# Patient Record
Sex: Female | Born: 2000 | Race: White | Hispanic: No | Marital: Single | Smoking: Never smoker
Health system: Southern US, Community
[De-identification: ages and names within clinical notes are randomized; demographics above are authoritative.]

## PROBLEM LIST (undated history)

## (undated) HISTORY — PX: WISDOM TOOTH EXTRACTION: SHX21

## (undated) HISTORY — PX: DENTAL SURGERY: SHX609

---
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
## 2022-07-02 DIAGNOSIS — Z114 Encounter for screening for human immunodeficiency virus [HIV]: Secondary | ICD-10-CM | POA: Diagnosis not present

## 2022-07-02 DIAGNOSIS — Z113 Encounter for screening for infections with a predominantly sexual mode of transmission: Secondary | ICD-10-CM | POA: Diagnosis not present

## 2022-07-04 DIAGNOSIS — H65191 Other acute nonsuppurative otitis media, right ear: Secondary | ICD-10-CM | POA: Diagnosis not present

## 2022-07-04 DIAGNOSIS — J019 Acute sinusitis, unspecified: Secondary | ICD-10-CM | POA: Diagnosis not present

## 2022-08-06 DIAGNOSIS — Z113 Encounter for screening for infections with a predominantly sexual mode of transmission: Secondary | ICD-10-CM | POA: Diagnosis not present

## 2022-08-06 DIAGNOSIS — Z130 Encounter for screening for diseases of the blood and blood-forming organs and certain disorders involving the immune mechanism: Secondary | ICD-10-CM | POA: Diagnosis not present

## 2022-08-06 DIAGNOSIS — Z0000 Encounter for general adult medical examination without abnormal findings: Secondary | ICD-10-CM | POA: Diagnosis not present

## 2022-08-06 DIAGNOSIS — Z1329 Encounter for screening for other suspected endocrine disorder: Secondary | ICD-10-CM | POA: Diagnosis not present

## 2022-08-06 DIAGNOSIS — Z713 Dietary counseling and surveillance: Secondary | ICD-10-CM | POA: Diagnosis not present

## 2022-08-06 DIAGNOSIS — Z131 Encounter for screening for diabetes mellitus: Secondary | ICD-10-CM | POA: Diagnosis not present

## 2022-08-06 DIAGNOSIS — Z13220 Encounter for screening for lipoid disorders: Secondary | ICD-10-CM | POA: Diagnosis not present

## 2022-10-01 DIAGNOSIS — N760 Acute vaginitis: Secondary | ICD-10-CM | POA: Diagnosis not present

## 2022-10-01 DIAGNOSIS — Z113 Encounter for screening for infections with a predominantly sexual mode of transmission: Secondary | ICD-10-CM | POA: Diagnosis not present

## 2022-10-29 DIAGNOSIS — H6123 Impacted cerumen, bilateral: Secondary | ICD-10-CM | POA: Diagnosis not present

## 2022-10-29 DIAGNOSIS — H9191 Unspecified hearing loss, right ear: Secondary | ICD-10-CM | POA: Diagnosis not present

## 2022-10-29 DIAGNOSIS — H66001 Acute suppurative otitis media without spontaneous rupture of ear drum, right ear: Secondary | ICD-10-CM | POA: Diagnosis not present

## 2023-03-10 DIAGNOSIS — Z113 Encounter for screening for infections with a predominantly sexual mode of transmission: Secondary | ICD-10-CM | POA: Diagnosis not present

## 2023-04-30 DIAGNOSIS — R110 Nausea: Secondary | ICD-10-CM | POA: Diagnosis not present

## 2023-04-30 DIAGNOSIS — Z3202 Encounter for pregnancy test, result negative: Secondary | ICD-10-CM | POA: Diagnosis not present

## 2023-04-30 DIAGNOSIS — N643 Galactorrhea not associated with childbirth: Secondary | ICD-10-CM | POA: Diagnosis not present

## 2023-04-30 DIAGNOSIS — N644 Mastodynia: Secondary | ICD-10-CM | POA: Diagnosis not present

## 2023-05-15 ENCOUNTER — Other Ambulatory Visit: Payer: Self-pay

## 2023-05-15 ENCOUNTER — Emergency Department
Admission: EM | Admit: 2023-05-15 | Discharge: 2023-05-15 | Disposition: A | Discharge status: Home / Self Care | Payer: Medicaid Other | Attending: Emergency Medicine | Admitting: Emergency Medicine

## 2023-05-15 DIAGNOSIS — J029 Acute pharyngitis, unspecified: Secondary | ICD-10-CM | POA: Diagnosis not present

## 2023-05-15 LAB — CBC WITH DIFFERENTIAL/PLATELET
Abs Immature Granulocytes: 0.05 10*3/uL (ref 0.00–0.07)
Basophils Absolute: 0 10*3/uL (ref 0.0–0.1)
Basophils Relative: 0 %
Eosinophils Absolute: 0 10*3/uL (ref 0.0–0.5)
Eosinophils Relative: 0 %
HCT: 39.4 % (ref 36.0–46.0)
Hemoglobin: 13.2 g/dL (ref 12.0–15.0)
Immature Granulocytes: 0 %
Lymphocytes Relative: 8 %
Lymphs Abs: 1.1 10*3/uL (ref 0.7–4.0)
MCH: 31 pg (ref 26.0–34.0)
MCHC: 33.5 g/dL (ref 30.0–36.0)
MCV: 92.5 fL (ref 80.0–100.0)
Monocytes Absolute: 1.4 10*3/uL — ABNORMAL HIGH (ref 0.1–1.0)
Monocytes Relative: 9 %
Neutro Abs: 12.3 10*3/uL — ABNORMAL HIGH (ref 1.7–7.7)
Neutrophils Relative %: 83 %
Platelets: 253 10*3/uL (ref 150–400)
RBC: 4.26 MIL/uL (ref 3.87–5.11)
RDW: 12.5 % (ref 11.5–15.5)
WBC: 14.9 10*3/uL — ABNORMAL HIGH (ref 4.0–10.5)
nRBC: 0 % (ref 0.0–0.2)

## 2023-05-15 LAB — BASIC METABOLIC PANEL
Anion gap: 10 (ref 5–15)
BUN: 8 mg/dL (ref 6–20)
CO2: 22 mmol/L (ref 22–32)
Calcium: 9.2 mg/dL (ref 8.9–10.3)
Chloride: 103 mmol/L (ref 98–111)
Creatinine, Ser: 0.83 mg/dL (ref 0.44–1.00)
GFR, Estimated: >60 mL/min (ref >60)
Glucose, Bld: 99 mg/dL (ref 70–99)
Potassium: 4.3 mmol/L (ref 3.5–5.1)
Sodium: 135 mmol/L (ref 135–145)

## 2023-05-15 LAB — GROUP A STREP BY PCR: Group A Strep by PCR: NOT DETECTED

## 2023-05-15 MED ORDER — AMOXICILLIN 500 MG PO TABS: 500.0000 mg | ORAL_TABLET | Freq: Three times a day (TID) | ORAL | 0 refills | Status: DC

## 2023-05-15 MED ORDER — DEXAMETHASONE 10 MG/ML FOR PEDIATRIC ORAL USE
10.0000 mg | Freq: Once | INTRAMUSCULAR | Status: AC
  Administered 2023-05-15: 10 mg via ORAL
  Filled 2023-05-15: qty 1

## 2023-05-15 MED ORDER — AMOXICILLIN 500 MG PO TABS: 500.0000 mg | ORAL_TABLET | Freq: Three times a day (TID) | ORAL | 0 refills | Status: AC

## 2023-05-15 MED ORDER — KETOROLAC TROMETHAMINE 30 MG/ML IJ SOLN
30.0000 mg | Freq: Once | INTRAMUSCULAR | Status: AC
  Administered 2023-05-15: 30 mg via INTRAMUSCULAR
  Filled 2023-05-15: qty 1

## 2023-05-15 NOTE — ED Provider Notes (Signed)
[facility] Provider Note    Event Date/Time   First MD Initiated Contact with Patient 05/15/23 1123     (approximate)   History   Chief Complaint Sore Throat   HPI  Tamara Joseph is a 22 y.o. female with no significant past medical history who presents to the ED complaining of sore throat.  Patient reports that over the past 2 days she has had increasing pain in both sides of her throat, especially when swallowing.  She denies any associated fevers and has not had any cough, nausea, or vomiting.  She has not taken anything for her symptoms prior to arrival.  She initially presented to the walk-in clinic, referred to the ED for further evaluation.     Physical Exam   Triage Vital Signs: ED Triage Vitals  Encounter Vitals Group     BP 05/15/23 1031 122/85     Systolic BP Percentile --      Diastolic BP Percentile --      Pulse Rate 05/15/23 1031 92     Resp 05/15/23 1031 18     Temp 05/15/23 1031 99.2 F (37.3 C)     Temp src --      SpO2 05/15/23 1031 100 %     Weight --      Height --      Head Circumference --      Peak Flow --      Pain Score 05/15/23 1020 8     Pain Loc --      Pain Education --      Exclude from Growth Chart --     Most recent vital signs: Vitals:   05/15/23 1031  BP: 122/85  Pulse: 92  Resp: 18  Temp: 99.2 F (37.3 C)  SpO2: 100%    Constitutional: Alert and oriented. Eyes: Conjunctivae are normal. Head: Atraumatic. Nose: No congestion/rhinnorhea. Mouth/Throat: Mucous membranes are moist.  Posterior oropharynx with tonsillar edema, erythema, and exudates noted.  No significant unilateral swelling or uvular deviation. Neck: Tender cervical lymphadenopathy noted. Cardiovascular: Normal rate, regular rhythm. Grossly normal heart sounds.  2+ radial pulses bilaterally. Respiratory: Normal respiratory effort.  No retractions. Lungs CTAB. Gastrointestinal: Soft and nontender. No distention. Musculoskeletal: No  lower extremity tenderness nor edema.  Neurologic:  Normal speech and language. No gross focal neurologic deficits are appreciated.    ED Results / Procedures / Treatments   Labs (all labs ordered are listed, but only abnormal results are displayed) Labs Reviewed  CBC WITH DIFFERENTIAL/PLATELET - Abnormal; Notable for the following components:      Result Value   WBC 14.9 (*)    Neutro Abs 12.3 (*)    Monocytes Absolute 1.4 (*)    All other components within normal limits  GROUP A STREP BY PCR  BASIC METABOLIC PANEL    PROCEDURES:  Critical Care performed: No  Procedures   MEDICATIONS ORDERED IN ED: Medications  dexamethasone (DECADRON) 10 MG/ML injection for Pediatric ORAL use 10 mg (10 mg Oral Given 05/15/23 1241)  ketorolac (TORADOL) 30 MG/ML injection 30 mg (30 mg Intramuscular Given 05/15/23 1242)     IMPRESSION / MDM / ASSESSMENT AND PLAN / ED COURSE  I reviewed the triage vital signs and the nursing notes.                              22 y.o. female with no significant past medical history  who presents to the ED with 2 days of increasing sore throat.  Patient's presentation is most consistent with acute complicated illness / injury requiring diagnostic workup.  Differential diagnosis includes, but is not limited to, strep pharyngitis, viral pharyngitis, peritonsillar abscess.  Patient nontoxic-appearing and in no acute distress, vital signs are unremarkable.  Labs with mild leukocytosis, no significant anemia, electrolyte abnormality, or AKI.  No clinical concerns for peritonsillar abscess at this time, exam appears consistent with an uncomplicated strep pharyngitis.  Strep testing pending at this time, will treat symptomatically with IM Toradol and oral Decadron.  Patient reports pain improved on reassessment, strep testing is negative however due to high clinical concern for strep pharyngitis, we will treat with amoxicillin.  Patient counseled to follow-up with  PCP and otherwise return to the ED for new or worsening symptoms.  Patient agrees with plan.      FINAL CLINICAL IMPRESSION(S) / ED DIAGNOSES   Final diagnoses:  Pharyngitis, unspecified etiology     Rx / DC Orders   ED Discharge Orders          Ordered    amoxicillin (AMOXIL) 500 MG tablet  3 times daily        05/15/23 1322             Note:  This document was prepared using Dragon voice recognition software and may include unintentional dictation errors.   Chesley Noon, MD 05/15/23 1322

## 2023-05-15 NOTE — ED Triage Notes (Signed)
Pt comes from [facility] with c/o sore throat, pain with swallowing. Pt sent over to rule out peritonsillar abscess.  Pt states difficulty swallowing, and some right side neck swelling.

## 2023-05-28 DIAGNOSIS — Z114 Encounter for screening for human immunodeficiency virus [HIV]: Secondary | ICD-10-CM | POA: Diagnosis not present

## 2023-05-28 DIAGNOSIS — Z113 Encounter for screening for infections with a predominantly sexual mode of transmission: Secondary | ICD-10-CM | POA: Diagnosis not present

## 2023-09-17 DIAGNOSIS — Z114 Encounter for screening for human immunodeficiency virus [HIV]: Secondary | ICD-10-CM | POA: Diagnosis not present

## 2023-09-17 DIAGNOSIS — Z113 Encounter for screening for infections with a predominantly sexual mode of transmission: Secondary | ICD-10-CM | POA: Diagnosis not present

## 2023-09-29 DIAGNOSIS — A545 Gonococcal pharyngitis: Secondary | ICD-10-CM | POA: Diagnosis not present

## 2023-10-13 DIAGNOSIS — Z113 Encounter for screening for infections with a predominantly sexual mode of transmission: Secondary | ICD-10-CM | POA: Diagnosis not present

## 2023-10-20 DIAGNOSIS — N760 Acute vaginitis: Secondary | ICD-10-CM | POA: Diagnosis not present

## 2023-10-20 DIAGNOSIS — N390 Urinary tract infection, site not specified: Secondary | ICD-10-CM | POA: Diagnosis not present

## 2023-10-20 DIAGNOSIS — B3731 Acute candidiasis of vulva and vagina: Secondary | ICD-10-CM | POA: Diagnosis not present

## 2023-11-16 ENCOUNTER — Ambulatory Visit
Admission: EM | Admit: 2023-11-16 | Discharge: 2023-11-16 | Disposition: A | Discharge status: Home / Self Care | Payer: Medicaid Other | Attending: Emergency Medicine | Admitting: Emergency Medicine

## 2023-11-16 DIAGNOSIS — R300 Dysuria: Secondary | ICD-10-CM | POA: Diagnosis not present

## 2023-11-16 DIAGNOSIS — N898 Other specified noninflammatory disorders of vagina: Secondary | ICD-10-CM | POA: Diagnosis not present

## 2023-11-16 LAB — POCT URINALYSIS DIP (MANUAL ENTRY)
Bilirubin, UA: NEGATIVE
Glucose, UA: NEGATIVE mg/dL
Ketones, POC UA: NEGATIVE mg/dL
Nitrite, UA: NEGATIVE
Protein Ur, POC: NEGATIVE mg/dL
Spec Grav, UA: 1.015 (ref 1.010–1.025)
Urobilinogen, UA: 0.2 U/dL
pH, UA: 7 (ref 5.0–8.0)

## 2023-11-16 LAB — POCT URINE PREGNANCY: Preg Test, Ur: NEGATIVE

## 2023-11-16 MED ORDER — FLUCONAZOLE 150 MG PO TABS: 150.0000 mg | ORAL_TABLET | Freq: Every day | ORAL | 0 refills | Status: DC

## 2023-11-16 NOTE — ED Provider Notes (Signed)
[facility]    CSN: [id number] Arrival date & time: 11/16/23  1059      History   Chief Complaint No chief complaint on file.   HPI Tamara Joseph is a 23 y.o. female.  Patient presents with 1 week history of thick white vaginal discharge and vaginal irritation.  She states her current symptoms are similar to previous yeast infection.  No fever, abdominal pain, flank pain, pelvic pain, hematuria.  She used a boric acid suppository yesterday.  Patient was seen at [facility] clinic on 10/20/2023; diagnosed with vaginitis, UTI, yeast vaginitis; treated with Keflex and Diflucan.  The history is provided by the patient and medical records.    No past medical history on file.  There are no active problems to display for this patient.   Past Surgical History:  Procedure Laterality Date   WISDOM TOOTH EXTRACTION     all 4 wisdom teeth extracted per patient    OB History   No obstetric history on file.      Home Medications    Prior to Admission medications   Medication Sig Start Date End Date Taking? Authorizing Provider  fluconazole (DIFLUCAN) 150 MG tablet Take 1 tablet (150 mg total) by mouth daily. Take one tablet today.  May repeat in 3 days. 11/16/23  Yes Mickie Bail, NP  acetaminophen (TYLENOL 8 HOUR) 650 MG CR tablet Take 1 tablet (650 mg total) by mouth every 8 (eight) hours as needed for pain. 06/22/14   Emilia Beck, PA-C  acetaminophen (TYLENOL) 325 MG tablet Take 2 tablets (650 mg total) by mouth every 6 (six) hours as needed. 06/17/18   Sherrilee Gilles, NP  dicyclomine (BENTYL) 10 MG capsule Take 1 capsule (10 mg total) by mouth 4 (four) times daily -  before meals and at bedtime for 3 days. 05/25/19 05/28/19  Sherrilee Gilles, NP  ibuprofen (ADVIL,MOTRIN) 600 MG tablet Take 1 tablet (600 mg total) by mouth every 6 (six) hours as needed. 06/17/18   Sherrilee Gilles, NP    Family History No family history on file.  Social History Social  History   Tobacco Use   Smoking status: Never   Smokeless tobacco: Never  Substance Use Topics   Alcohol use: No     Allergies   Patient has no known allergies.   Review of Systems Review of Systems  Constitutional:  Negative for chills and fever.  Gastrointestinal:  Negative for abdominal pain.  Genitourinary:  Positive for vaginal discharge. Negative for dysuria, flank pain, hematuria and pelvic pain.  Skin:  Negative for rash and wound.     Physical Exam Triage Vital Signs ED Triage Vitals  Encounter Vitals Group     BP      Systolic BP Percentile      Diastolic BP Percentile      Pulse      Resp      Temp      Temp src      SpO2      Weight      Height      Head Circumference      Peak Flow      Pain Score      Pain Loc      Pain Education      Exclude from Growth Chart    No data found.  Updated Vital Signs BP 122/68   Pulse 79   Temp 97.7 F (36.5 C)   Resp 18  SpO2 99%   Visual Acuity Right Eye Distance:   Left Eye Distance:   Bilateral Distance:    Right Eye Near:   Left Eye Near:    Bilateral Near:     Physical Exam Constitutional:      General: She is not in acute distress. HENT:     Mouth/Throat:     Mouth: Mucous membranes are moist.  Cardiovascular:     Rate and Rhythm: Normal rate and regular rhythm.  Pulmonary:     Effort: Pulmonary effort is normal. No respiratory distress.  Abdominal:     General: Bowel sounds are normal.     Palpations: Abdomen is soft.     Tenderness: There is no abdominal tenderness. There is no right CVA tenderness, left CVA tenderness, guarding or rebound.  Neurological:     Mental Status: She is alert.      UC Treatments / Results  Labs (all labs ordered are listed, but only abnormal results are displayed) Labs Reviewed  POCT URINALYSIS DIP (MANUAL ENTRY) - Abnormal; Notable for the following components:      Result Value   Clarity, UA cloudy (*)    Blood, UA trace-intact (*)     Leukocytes, UA Large (3+) (*)    All other components within normal limits  URINE CULTURE  POCT URINE PREGNANCY  CERVICOVAGINAL ANCILLARY ONLY    EKG   Radiology No results found.  Procedures Procedures (including critical care time)  Medications Ordered in UC Medications - No data to display  Initial Impression / Assessment and Plan / UC Course  I have reviewed the triage vital signs and the nursing notes.  Pertinent labs & imaging results that were available during my care of the patient were reviewed by me and considered in my medical decision making (see chart for details).    Vaginal discharge, dysuria.  Urine positive for leukocytes but negative for nitrite.  Culture pending.  Urine pregnancy negative.  Patient obtained vaginal self swab for testing.  Treating today with Diflucan as patient reports her current symptoms are similar to previous yeast infection.  Discussed that we will call her if the cytology shows the need for additional treatment.  Instructed her to abstain from sexual activity for 7 days.  Education provided on vaginal yeast infection.  Instructed her to follow-up with her gynecologist.  She agrees to plan of care.  Final Clinical Impressions(s) / UC Diagnoses   Final diagnoses:  Vaginal discharge  Dysuria     Discharge Instructions      Take the Diflucan as directed.     Your vaginal tests are pending.  If your test results are positive, we will call you.  You and your sexual partner(s) may require treatment at that time.  Do not have sexual activity for at least 7 days.    Follow up with your gynecologist.        ED Prescriptions     Medication Sig Dispense Auth. Provider   fluconazole (DIFLUCAN) 150 MG tablet Take 1 tablet (150 mg total) by mouth daily. Take one tablet today.  May repeat in 3 days. 2 tablet Mickie Bail, NP      PDMP not reviewed this encounter.   Mickie Bail, NP 11/16/23 1226

## 2023-11-16 NOTE — Discharge Instructions (Addendum)
Take the Diflucan as directed.     Your vaginal tests are pending.  If your test results are positive, we will call you.  You and your sexual partner(s) may require treatment at that time.  Do not have sexual activity for at least 7 days.    Follow up with your gynecologist.

## 2023-11-17 LAB — URINE CULTURE: Culture: <10000 — AB

## 2023-11-17 LAB — CERVICOVAGINAL ANCILLARY ONLY
Bacterial Vaginitis (gardnerella): NEGATIVE
Candida Glabrata: NEGATIVE
Candida Vaginitis: POSITIVE — AB
Chlamydia: NEGATIVE
Comment: NEGATIVE
Comment: NEGATIVE
Comment: NEGATIVE
Comment: NEGATIVE
Comment: NEGATIVE
Comment: NORMAL
Neisseria Gonorrhea: NEGATIVE
Trichomonas: NEGATIVE

## 2023-11-24 DIAGNOSIS — J028 Acute pharyngitis due to other specified organisms: Secondary | ICD-10-CM | POA: Diagnosis not present

## 2023-11-24 DIAGNOSIS — Z113 Encounter for screening for infections with a predominantly sexual mode of transmission: Secondary | ICD-10-CM | POA: Diagnosis not present

## 2023-11-24 DIAGNOSIS — J039 Acute tonsillitis, unspecified: Secondary | ICD-10-CM | POA: Diagnosis not present

## 2024-01-11 ENCOUNTER — Ambulatory Visit
Admission: EM | Admit: 2024-01-11 | Discharge: 2024-01-11 | Disposition: A | Discharge status: Home / Self Care | Attending: Emergency Medicine | Admitting: Emergency Medicine

## 2024-01-11 DIAGNOSIS — L292 Pruritus vulvae: Secondary | ICD-10-CM | POA: Insufficient documentation

## 2024-01-11 DIAGNOSIS — N898 Other specified noninflammatory disorders of vagina: Secondary | ICD-10-CM | POA: Insufficient documentation

## 2024-01-11 LAB — POCT URINALYSIS DIP (MANUAL ENTRY)
Bilirubin, UA: NEGATIVE
Glucose, UA: NEGATIVE mg/dL
Ketones, POC UA: NEGATIVE mg/dL
Nitrite, UA: NEGATIVE
Protein Ur, POC: NEGATIVE mg/dL
Spec Grav, UA: 1.015 (ref 1.010–1.025)
Urobilinogen, UA: 0.2 U/dL
pH, UA: 7 (ref 5.0–8.0)

## 2024-01-11 LAB — POCT URINE PREGNANCY: Preg Test, Ur: NEGATIVE

## 2024-01-11 MED ORDER — FLUCONAZOLE 150 MG PO TABS: 150.0000 mg | ORAL_TABLET | Freq: Every day | ORAL | 0 refills | Status: AC

## 2024-01-11 NOTE — ED Provider Notes (Signed)
 Renaldo Fiddler    CSN: [id number] Arrival date & time: 01/11/24  1255      History   Chief Complaint Chief Complaint  Patient presents with   Vaginal Discharge   SEXUALLY TRANSMITTED DISEASE    HPI Tamara Joseph is a 23 y.o. female.  Patient presents with 2 day history of vaginal discharge and itching.  She is concerned for yeast infection but would like STD testing also.  No fever, rash, abdominal pain, pelvic pain, dysuria, hematuria.  No treatment at home.  She was seen here on 11/16/2023; for vaginal discharge and dysuria; positive for yeast infection and treated with Diflucan.  The history is provided by the patient and medical records.    History reviewed. No pertinent past medical history.  There are no active problems to display for this patient.   Past Surgical History:  Procedure Laterality Date   DENTAL SURGERY     WISDOM TOOTH EXTRACTION     all 4 wisdom teeth extracted per patient    OB History   No obstetric history on file.      Home Medications    Prior to Admission medications   Medication Sig Start Date End Date Taking? Authorizing Provider  fluconazole (DIFLUCAN) 150 MG tablet Take 1 tablet (150 mg total) by mouth daily. Take one tablet today.  May repeat in 3 days. 01/11/24  Yes Mickie Bail, NP  acetaminophen (TYLENOL 8 HOUR) 650 MG CR tablet Take 1 tablet (650 mg total) by mouth every 8 (eight) hours as needed for pain. 06/22/14   Emilia Beck, PA-C  acetaminophen (TYLENOL) 325 MG tablet Take 2 tablets (650 mg total) by mouth every 6 (six) hours as needed. 06/17/18   Sherrilee Gilles, NP  dicyclomine (BENTYL) 10 MG capsule Take 1 capsule (10 mg total) by mouth 4 (four) times daily -  before meals and at bedtime for 3 days. Patient not taking: Reported on 01/11/2024 05/25/19 05/28/19  Sherrilee Gilles, NP  ibuprofen (ADVIL,MOTRIN) 600 MG tablet Take 1 tablet (600 mg total) by mouth every 6 (six) hours as needed. 06/17/18    Sherrilee Gilles, NP    Family History History reviewed. No pertinent family history.  Social History Social History   Tobacco Use   Smoking status: Never   Smokeless tobacco: Never  Vaping Use   Vaping status: Never Used  Substance Use Topics   Alcohol use: Yes    Comment: Occasionally     Allergies   Patient has no known allergies.   Review of Systems Review of Systems  Constitutional:  Negative for chills and fever.  Gastrointestinal:  Negative for abdominal pain.  Genitourinary:  Positive for vaginal discharge. Negative for dysuria, flank pain, frequency, hematuria and pelvic pain.  Skin:  Negative for color change and rash.     Physical Exam Triage Vital Signs ED Triage Vitals  Encounter Vitals Group     BP      Systolic BP Percentile      Diastolic BP Percentile      Pulse      Resp      Temp      Temp src      SpO2      Weight      Height      Head Circumference      Peak Flow      Pain Score      Pain Loc      Pain Education  Exclude from Growth Chart    No data found.  Updated Vital Signs BP 124/81   Pulse 71   Temp 98.3 F (36.8 C)   Resp 18   LMP 12/25/2023   SpO2 98%   Visual Acuity Right Eye Distance:   Left Eye Distance:   Bilateral Distance:    Right Eye Near:   Left Eye Near:    Bilateral Near:     Physical Exam Constitutional:      General: She is not in acute distress. HENT:     Mouth/Throat:     Mouth: Mucous membranes are moist.  Cardiovascular:     Rate and Rhythm: Normal rate and regular rhythm.  Pulmonary:     Effort: Pulmonary effort is normal. No respiratory distress.  Abdominal:     General: Bowel sounds are normal.     Palpations: Abdomen is soft.     Tenderness: There is no abdominal tenderness. There is no right CVA tenderness, left CVA tenderness, guarding or rebound.  Genitourinary:    Comments: Patient declines GU exam. Neurological:     Mental Status: She is alert.      UC  Treatments / Results  Labs (all labs ordered are listed, but only abnormal results are displayed) Labs Reviewed  POCT URINALYSIS DIP (MANUAL ENTRY) - Abnormal; Notable for the following components:      Result Value   Clarity, UA cloudy (*)    Blood, UA trace-intact (*)    Leukocytes, UA Large (3+) (*)    All other components within normal limits  POCT URINE PREGNANCY  CERVICOVAGINAL ANCILLARY ONLY    EKG   Radiology No results found.  Procedures Procedures (including critical care time)  Medications Ordered in UC Medications - No data to display  Initial Impression / Assessment and Plan / UC Course  I have reviewed the triage vital signs and the nursing notes.  Pertinent labs & imaging results that were available during my care of the patient were reviewed by me and considered in my medical decision making (see chart for details).    Vaginal discharge, vaginal itching.  Patient obtained self swab for testing.  Treating with Diflucan.  Discussed that we will call if test results are positive.  Discussed that she may require additional treatment at that time.  Instructed patient to abstain from sexual activity for at least 7 days.  Instructed her to follow-up with her PCP or gynecologist if her symptoms are not improving.  She agrees to plan of care.   Final Clinical Impressions(s) / UC Diagnoses   Final diagnoses:  Vaginal discharge  Vaginal itching     Discharge Instructions      Take the Diflucan as directed.      Your vaginal tests are pending.  If your test results are positive, we will call you.  You and your sexual partner(s) may require treatment at that time.  Do not have sexual activity for at least 7 days.    Follow-up with your primary care provider or gynecologist if your symptoms are not improving.      ED Prescriptions     Medication Sig Dispense Auth. Provider   fluconazole (DIFLUCAN) 150 MG tablet Take 1 tablet (150 mg total) by mouth daily.  Take one tablet today.  May repeat in 3 days. 2 tablet Mickie Bail, NP      PDMP not reviewed this encounter.   Mickie Bail, NP 01/11/24 1318

## 2024-01-11 NOTE — ED Triage Notes (Signed)
 Patient to Urgent Care with complaints of vaginal discharge/ itching/ discomfort. Symptoms x2-3 days. Denies any urinary symptoms.  Also requests STD testing.

## 2024-01-11 NOTE — Discharge Instructions (Signed)
Take the Diflucan as directed.   Your vaginal tests are pending.  If your test results are positive, we will call you.  You and your sexual partner(s) may require treatment at that time.  Do not have sexual activity for at least 7 days.    Follow up with your primary care provider or gynecologist if your symptoms are not improving.      

## 2024-01-13 ENCOUNTER — Telehealth (HOSPITAL_COMMUNITY): Payer: Self-pay

## 2024-01-13 LAB — CERVICOVAGINAL ANCILLARY ONLY
Bacterial Vaginitis (gardnerella): POSITIVE — AB
Candida Glabrata: NEGATIVE
Candida Vaginitis: POSITIVE — AB
Chlamydia: NEGATIVE
Comment: NEGATIVE
Comment: NEGATIVE
Comment: NEGATIVE
Comment: NEGATIVE
Comment: NEGATIVE
Comment: NORMAL
Neisseria Gonorrhea: NEGATIVE
Trichomonas: NEGATIVE

## 2024-01-13 MED ORDER — METRONIDAZOLE 500 MG PO TABS: 500.0000 mg | ORAL_TABLET | Freq: Two times a day (BID) | ORAL | 0 refills | Status: AC

## 2024-01-13 NOTE — Telephone Encounter (Signed)
 Per protocol, pt requires tx with metronidazole. Rx sent to pharmacy on file.

## 2024-03-13 DIAGNOSIS — N898 Other specified noninflammatory disorders of vagina: Secondary | ICD-10-CM | POA: Diagnosis not present

## 2024-03-13 DIAGNOSIS — B3741 Candidal cystitis and urethritis: Secondary | ICD-10-CM | POA: Diagnosis not present

## 2024-05-04 DIAGNOSIS — J358 Other chronic diseases of tonsils and adenoids: Secondary | ICD-10-CM | POA: Diagnosis not present

## 2024-05-04 DIAGNOSIS — Z3202 Encounter for pregnancy test, result negative: Secondary | ICD-10-CM | POA: Diagnosis not present

## 2024-05-04 DIAGNOSIS — Z202 Contact with and (suspected) exposure to infections with a predominantly sexual mode of transmission: Secondary | ICD-10-CM | POA: Diagnosis not present

## 2024-05-04 DIAGNOSIS — J029 Acute pharyngitis, unspecified: Secondary | ICD-10-CM | POA: Diagnosis not present

## 2024-06-13 DIAGNOSIS — J020 Streptococcal pharyngitis: Secondary | ICD-10-CM | POA: Diagnosis not present

## 2024-07-26 ENCOUNTER — Encounter: Payer: Self-pay | Admitting: Nurse Practitioner

## 2024-07-26 ENCOUNTER — Ambulatory Visit: Admitting: Nurse Practitioner

## 2024-07-26 VITALS — BP 116/74 | HR 63 | Ht 62.0 in | Wt 153.4 lb

## 2024-07-26 DIAGNOSIS — Z3202 Encounter for pregnancy test, result negative: Secondary | ICD-10-CM | POA: Diagnosis not present

## 2024-07-26 DIAGNOSIS — Z01419 Encounter for gynecological examination (general) (routine) without abnormal findings: Secondary | ICD-10-CM | POA: Diagnosis not present

## 2024-07-26 DIAGNOSIS — Z113 Encounter for screening for infections with a predominantly sexual mode of transmission: Secondary | ICD-10-CM

## 2024-07-26 DIAGNOSIS — Z30011 Encounter for initial prescription of contraceptive pills: Secondary | ICD-10-CM | POA: Diagnosis not present

## 2024-07-26 DIAGNOSIS — Z309 Encounter for contraceptive management, unspecified: Secondary | ICD-10-CM | POA: Diagnosis not present

## 2024-07-26 DIAGNOSIS — R87610 Atypical squamous cells of undetermined significance on cytologic smear of cervix (ASC-US): Secondary | ICD-10-CM | POA: Diagnosis not present

## 2024-07-26 DIAGNOSIS — Z3009 Encounter for other general counseling and advice on contraception: Secondary | ICD-10-CM

## 2024-07-26 LAB — WET PREP FOR TRICH, YEAST, CLUE
Clue Cell Exam: NEGATIVE
Trichomonas Exam: NEGATIVE
Yeast Exam: NEGATIVE

## 2024-07-26 LAB — PREGNANCY, URINE: Preg Test, Ur: NEGATIVE

## 2024-07-26 LAB — HM HIV SCREENING LAB: HM HIV Screening: NEGATIVE

## 2024-07-26 MED ORDER — NORELGESTROMIN-ETH ESTRADIOL 150-35 MCG/24HR TD PTWK: 1.0000 | MEDICATED_PATCH | TRANSDERMAL | Status: AC

## 2024-07-26 MED ORDER — ULIPRISTAL ACETATE 30 MG PO TABS: 30.0000 mg | ORAL_TABLET | Freq: Once | ORAL | Status: AC

## 2024-07-26 NOTE — Progress Notes (Signed)
 [facility] [address] [place] [address] Main phone: [phone]  Family Planning Visit - Initial Visit  Subjective:  Tamara Joseph is a 23 y.o.  [id number]   being seen today for an initial annual visit and to discuss reproductive life planning.  The patient is currently using withdrawal  for pregnancy prevention. Patient does not want a pregnancy in the next year.   Patient reports they are looking for a method with the following characteristics:  Ready when they are  Patient has the following medical conditions: There are no active problems to display for this patient.   Chief Complaint  Patient presents with   Annual Exam    PE    HPI Patient reports usually regular periods, but LMP 9/21. She is overdue for her period but started having brown old blood on Sunday. She had a negative pregnancy test on Sunday. Last unprotected intercourse was 07/22/24. She is not a tobacco user, she denies migraine w aura, hx of breast cancer or blood clots.   Diabetes screening This patient is 23 y.o. with a BMI of Body mass index is 28.06 kg/m.SABRA  Is patient eligible for diabetes screening (age >35 and BMI >25)?  no  Was Hgb A1c ordered? no  STI screening Patient reports 1 of partners in last year.  Does this patient desire STI screening?  Yes  Hepatitis C screening Has patient been screened once for HCV in the past?  No  No results found for: HCVAB  Does the patient meet criteria for HCV testing? No  (If yes-- Screen for HCV through [facility]) Criteria:  Since the last HCV result, does the patient have any of the following? - Current drug use - Have a partner with drug use - Has been incarcerated  Hepatitis B screening Does the patient meet criteria for HBV testing? No Criteria:  -Household, sexual or needle sharing contact with HBV -History of drug use -HIV positive -Those with known Hep  C  Cervical Cancer Screening  No Cervical Cancer Screening results to display.  Health Maintenance Due  Topic Date Due   HPV VACCINES (1 - 3-dose series) Never done   HIV Screening  Never done   Meningococcal B Vaccine (1 of 2 - Standard) Never done   Hepatitis C Screening  Never done   DTaP/Tdap/Td (1 - Tdap) Never done   Hepatitis B Vaccines 19-59 Average Risk (1 of 3 - 19+ 3-dose series) Never done   Cervical Cancer Screening (Pap smear)  Never done   Influenza Vaccine  Never done   COVID-19 Vaccine (1 - 2025-26 season) Never done    The following portions of the patient's history were reviewed and updated as appropriate: allergies, current medications, past family history, past medical history, past social history, past surgical history and problem list. Problem list updated.  See flowsheet for further details and programmatic requirements Hyperlink available at the top of the signed note in blue.  Flow sheet content below:  Pregnancy Intention Screening Does the patient want to become pregnant in the next year?: No Does the patient's partner want to become pregnant in the next year?: No Would the patient like to discuss contraceptive options today?: Yes Other:  Password: leo Sexual History What age did you start your period?: 12 How often do you have your period?: monthly Date of last sex?: 07/22/24 Has the patient had unprotected sex within the last 5 days?: Yes Do you  have sex with men, women, both men and women?: Men only In the past 2 months how many partners have you had sex with?: 1 In the past 12 months, how many partners have you had sex with?: 3 Is it possible that any of your sex partners in the past 12 months had sex with someone else whild they were still in a sexual relationship with you?: No What ways do you have sex?: Vaginal Do you or your partner use condoms and/or dental dams every time you have vaginal, oral or anal sex?: No Do you douche?: No Date of  last HIV test?:  (none) Have you ever had an STD?: Yes Have any of your partners had an STD?: Yes Partner Previous STD?: Chlamydia Date?:  (5 years ago) Have you or your partner ever shot up drugs?: No Have any of your partners used drugs in the past?: No Have you or your partners exchanged money or drugs for sex?: No  Objective:   Vitals:   07/26/24 1332  BP: 116/74  Pulse: 63  Weight: 153 lb 6.4 oz (69.6 kg)  Height: 5' 2 (1.575 m)    Physical Exam Vitals and nursing note reviewed. Chaperone present: pt declines.  Constitutional:      Appearance: Normal appearance.  HENT:     Head: Normocephalic and atraumatic.     Mouth/Throat:     Mouth: Mucous membranes are moist.     Pharynx: Oropharynx is clear. No oropharyngeal exudate or posterior oropharyngeal erythema.  Pulmonary:     Effort: Pulmonary effort is normal.  Abdominal:     General: Abdomen is flat.     Palpations: There is no mass.     Tenderness: There is no abdominal tenderness. There is no rebound.  Genitourinary:    General: Normal vulva.     Exam position: Lithotomy position.     Pubic Area: No rash or pubic lice.      Labia:        Right: No rash or lesion.        Left: No rash or lesion.      Vagina: Vaginal discharge (scant amount of brown old blood in vault) present. No erythema, bleeding or lesions.     Cervix: Cervical bleeding (bright red blood at os) present. No cervical motion tenderness, discharge, friability, lesion or erythema.     Rectum: Normal.     Comments: pH obscured by blood Lymphadenopathy:     Head:     Right side of head: No preauricular or posterior auricular adenopathy.     Left side of head: No preauricular or posterior auricular adenopathy.     Cervical: No cervical adenopathy.     Upper Body:     Right upper body: No supraclavicular, axillary or epitrochlear adenopathy.     Left upper body: No supraclavicular, axillary or epitrochlear adenopathy.     Lower Body: No right  inguinal adenopathy. No left inguinal adenopathy.  Skin:    General: Skin is warm and dry.     Findings: No rash.  Neurological:     Mental Status: She is alert and oriented to person, place, and time.  Psychiatric:        Mood and Affect: Mood normal.        Behavior: Behavior normal.     Assessment and Plan:  Tamara Joseph is a 23 y.o. female presenting to the [facility] Department for an initial annual wellness/contraceptive visit  Contraception counseling:  Reviewed  options based on patient desire and reproductive life plan. Patient is interested in [facility] patch. This was provided to the patient today. Made it clear to her to wait 24 hrs after Toy to begin the patch and that full efficacy would not be reached for 1 week to use back up method. Also asked pt to take a pregnancy test weekly for a month.   Risks, benefits, and typical effectiveness rates were reviewed.  Questions were answered.  Written information was also given to the patient to review.    The patient will follow up in  3 months for surveillance.  The patient was told to call with any further questions, or with any concerns about this method of contraception.  Emphasized use of condoms 100% of the time for STI prevention.  Emergency Contraception Precautions (ECP): Patient assessed for need of ECP. She is a candidate based on report of unprotected sex within past 120 hours (5 days).  Educated on ECP and reviewed options.  Patient desires Toy (Ulipristal).    1. Family planning (Primary) PT: neg Discharge consistent with light period [facility] given today  - Pregnancy, urine - ulipristal acetate (ELLA) tablet 30 mg - norelgestromin-ethinyl estradiol (XULANE) 150-35 MCG/24HR transdermal patch; Place 1 patch onto the skin once a week.  2. Screening for venereal disease  - Syphilis Serology, [place] [facility] - HIV [place] [facility] - Chlamydia/Gonorrhea [facility] - WET PREP FOR TRICH, YEAST, CLUE  3. Well  woman exam  - IGP, rfx Aptima HPV ASCU    Return in about 3 months (around 10/26/2024) for [facility].  No future appointments.   William Schake K Rosaland Shiffman, NP

## 2024-07-26 NOTE — Progress Notes (Addendum)
 Pt is here for PE. Pt is dispensed with # Boxes of Xulane and #1 box of Ella.  I provided counseling today regarding the patch and BC pill, the side effects and when to call clinic. Patient was given the opportunity to ask questions for any clarifications. Questions answered. Condoms declined. Wilkie Drought, RN.

## 2024-07-30 LAB — HPV APTIMA: HPV Aptima: POSITIVE — AB

## 2024-07-30 LAB — IGP, RFX APTIMA HPV ASCU: PAP Smear Comment: 0

## 2024-08-02 ENCOUNTER — Ambulatory Visit: Payer: Self-pay | Admitting: Family Medicine

## 2024-08-02 NOTE — Progress Notes (Signed)
 Will add to pap reminder list and send card.

## 2024-08-02 NOTE — Progress Notes (Signed)
 PAP smear reviewed, result: ASC-US , HPV positive. Based on this result and patient's prior cervical cancer screenings, [facility] currently recommends repeat PAP smear in 1 year .  Dorothyann Helling, MD 08/02/24  1:14 PM

## 2024-10-06 ENCOUNTER — Ambulatory Visit
